# Patient Record
Sex: Female | Born: 1990 | Race: Black or African American | Hispanic: No | Marital: Single | Smoking: Former smoker
Health system: Southern US, Community
[De-identification: ages and names within clinical notes are randomized; demographics above are authoritative.]

## PROBLEM LIST (undated history)

## (undated) DIAGNOSIS — B9689 Other specified bacterial agents as the cause of diseases classified elsewhere: Secondary | ICD-10-CM

## (undated) DIAGNOSIS — N760 Acute vaginitis: Secondary | ICD-10-CM

## (undated) DIAGNOSIS — E785 Hyperlipidemia, unspecified: Secondary | ICD-10-CM

---
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
## 2017-02-12 ENCOUNTER — Ambulatory Visit
Admission: EM | Admit: 2017-02-12 | Discharge: 2017-02-12 | Disposition: A | Discharge status: Home / Self Care | Payer: BC Managed Care – PPO | Attending: Family Medicine | Admitting: Family Medicine

## 2017-02-12 DIAGNOSIS — N760 Acute vaginitis: Secondary | ICD-10-CM

## 2017-02-12 DIAGNOSIS — B9689 Other specified bacterial agents as the cause of diseases classified elsewhere: Secondary | ICD-10-CM

## 2017-02-12 DIAGNOSIS — R05 Cough: Secondary | ICD-10-CM | POA: Diagnosis not present

## 2017-02-12 DIAGNOSIS — R062 Wheezing: Secondary | ICD-10-CM | POA: Diagnosis not present

## 2017-02-12 DIAGNOSIS — B373 Candidiasis of vulva and vagina: Secondary | ICD-10-CM | POA: Diagnosis not present

## 2017-02-12 DIAGNOSIS — B3731 Acute candidiasis of vulva and vagina: Secondary | ICD-10-CM

## 2017-02-12 HISTORY — DX: Other specified bacterial agents as the cause of diseases classified elsewhere: B96.89

## 2017-02-12 HISTORY — DX: Other specified bacterial agents as the cause of diseases classified elsewhere: N76.0

## 2017-02-12 LAB — WET PREP, GENITAL
Sperm: NONE SEEN
TRICH WET PREP: NONE SEEN

## 2017-02-12 LAB — CHLAMYDIA/NGC RT PCR (ARMC ONLY)
Chlamydia Tr: NOT DETECTED
N gonorrhoeae: NOT DETECTED

## 2017-02-12 MED ORDER — METRONIDAZOLE 500 MG PO TABS: 500.0000 mg | ORAL_TABLET | Freq: Two times a day (BID) | ORAL | 0 refills | Status: DC

## 2017-02-12 MED ORDER — FLUCONAZOLE 150 MG PO TABS: 150.0000 mg | ORAL_TABLET | Freq: Every day | ORAL | 0 refills | Status: DC

## 2017-02-12 NOTE — ED Triage Notes (Signed)
26 year old [place] woman is here today with complaints of productive cough; nasal drainage; wheezing for the last two weeks. She states she has only used her Albuterol inhaler for the wheezing but has not taken anything OTC to help with the cough and drainage.  She also states she has been having a vaginal discharge for the last week. She states she does have an odor and white discharge. She states she does have a history of bacterial vaginitis and has not used anything OTC for relief.

## 2017-02-12 NOTE — ED Provider Notes (Signed)
[facility]    CSN: [id number] Arrival date & time: 02/12/17  1909     History   Chief Complaint Chief Complaint  Patient presents with  . Cough  . Wheezing  . Vaginal Discharge    HPI Christine Vazquez is a 26 y.o. female.   The history is provided by the patient.  Cough  Associated symptoms: rhinorrhea and wheezing   Associated symptoms: no fever   Wheezing  Associated symptoms: cough and rhinorrhea   Associated symptoms: no fever   Vaginal Discharge  Quality:  White and malodorous Severity:  Moderate Onset quality:  Sudden Duration:  1 week Timing:  Constant Progression:  Worsening Chronicity:  New Context: spontaneously   Context: not recent antibiotic use   Relieved by:  None tried Associated symptoms: vaginal itching   Associated symptoms: no dysuria, no fever, no genital lesions, no rash and no urinary frequency   Risk factors: no new sexual partner  STD exposure: unknown.   URI  Presenting symptoms: congestion, cough and rhinorrhea   Presenting symptoms: no fever   Severity:  Mild Onset quality:  Sudden Duration:  2 weeks Timing:  Constant Progression:  Unchanged Relieved by:  Prescription medications (albuterol inhaler) Associated symptoms: wheezing   Associated symptoms: no sinus pain   Risk factors: chronic respiratory disease (asthma)     Past Medical History:  Diagnosis Date  . Bacterial vaginitis     There are no active problems to display for this patient.   History reviewed. No pertinent surgical history.  OB History    No data available       Home Medications    Prior to Admission medications   Medication Sig Start Date End Date Taking? Authorizing Provider  fluconazole (DIFLUCAN) 150 MG tablet Take 1 tablet (150 mg total) by mouth daily. 02/12/17   Payton Mccallum, MD  metroNIDAZOLE (FLAGYL) 500 MG tablet Take 1 tablet (500 mg total) by mouth 2 (two) times daily. 02/12/17   Payton Mccallum, MD    Family  History Family History  Problem Relation Age of Onset  . Breast cancer Mother   . Hypertension Father   . Hyperlipidemia Father   . Thrombosis Brother     Social History Social History  Substance Use Topics  . Smoking status: Former Smoker    Types: Cigars  . Smokeless tobacco: Former Neurosurgeon  . Alcohol use Yes     Comment: socially     Allergies   Patient has no known allergies.   Review of Systems Review of Systems  Constitutional: Negative for fever.  HENT: Positive for congestion and rhinorrhea. Negative for sinus pain.   Respiratory: Positive for cough and wheezing.   Genitourinary: Positive for vaginal discharge. Negative for dysuria.     Physical Exam Triage Vital Signs ED Triage Vitals  Enc Vitals Group     BP 02/12/17 1937 127/80     Pulse Rate 02/12/17 1937 91     Resp 02/12/17 1937 18     Temp 02/12/17 1937 99 F (37.2 C)     Temp Source 02/12/17 1937 Oral     SpO2 02/12/17 1937 100 %     Weight 02/12/17 1938 220 lb (99.8 kg)     Height 02/12/17 1938 5\' 4"  (1.626 m)     Head Circumference --      Peak Flow --      Pain Score --      Pain Loc --  Pain Edu? --      Excl. in GC? --    No data found.   Updated Vital Signs BP 127/80 (BP Location: Left Arm)   Pulse 91   Temp 99 F (37.2 C) (Oral)   Resp 18   Ht 5\' 4"  (1.626 m)   Wt 220 lb (99.8 kg)   LMP 02/06/2017 (Exact Date)   SpO2 100%   BMI 37.76 kg/m   Visual Acuity Right Eye Distance:   Left Eye Distance:   Bilateral Distance:    Right Eye Near:   Left Eye Near:    Bilateral Near:     Physical Exam  Constitutional: She appears well-developed and well-nourished. No distress.  HENT:  Head: Normocephalic and atraumatic.  Right Ear: Tympanic membrane, external ear and ear canal normal.  Left Ear: Tympanic membrane, external ear and ear canal normal.  Nose: Rhinorrhea present. No mucosal edema, nose lacerations, sinus tenderness, nasal deformity, septal deviation or nasal  septal hematoma. No epistaxis.  No foreign bodies. Right sinus exhibits no maxillary sinus tenderness and no frontal sinus tenderness. Left sinus exhibits no maxillary sinus tenderness and no frontal sinus tenderness.  Mouth/Throat: Uvula is midline, oropharynx is clear and moist and mucous membranes are normal. No oropharyngeal exudate.  Eyes: Pupils are equal, round, and reactive to light. Conjunctivae and EOM are normal. Right eye exhibits no discharge. Left eye exhibits no discharge. No scleral icterus.  Neck: Normal range of motion. Neck supple. No thyromegaly present.  Cardiovascular: Normal rate, regular rhythm and normal heart sounds.   Pulmonary/Chest: Effort normal and breath sounds normal. No respiratory distress. She has no wheezes. She has no rales.  Genitourinary: Pelvic exam was performed with patient supine. There is no lesion on the right labia. There is no lesion on the left labia. Cervix exhibits no discharge and no friability. No bleeding in the vagina. No foreign body in the vagina. No signs of injury around the vagina. Vaginal discharge found.  Lymphadenopathy:    She has no cervical adenopathy.  Skin: She is not diaphoretic.  Nursing note and vitals reviewed.    UC Treatments / Results  Labs (all labs ordered are listed, but only abnormal results are displayed) Labs Reviewed  WET PREP, GENITAL - Abnormal; Notable for the following:       Result Value   Yeast Wet Prep HPF POC PRESENT (*)    Clue Cells Wet Prep HPF POC PRESENT (*)    WBC, Wet Prep HPF POC MODERATE (*)    All other components within normal limits  CHLAMYDIA/NGC RT PCR [facility] ONLY)    EKG  EKG Interpretation None       Radiology No results found.  Procedures Procedures (including critical care time)  Medications Ordered in UC Medications - No data to display   Initial Impression / Assessment and Plan / UC Course  I have reviewed the triage vital signs and the nursing  notes.  Pertinent labs & imaging results that were available during my care of the patient were reviewed by me and considered in my medical decision making (see chart for details).       Final Clinical Impressions(s) / UC Diagnoses   Final diagnoses:  Wheezing  Bacterial vaginosis  Yeast vaginitis    New Prescriptions New Prescriptions   FLUCONAZOLE (DIFLUCAN) 150 MG TABLET    Take 1 tablet (150 mg total) by mouth daily.   METRONIDAZOLE (FLAGYL) 500 MG TABLET    Take 1 tablet (  500 mg total) by mouth 2 (two) times daily.   1. Lab results and diagnosis reviewed with patient 2. rx as per orders above; reviewed possible side effects, interactions, risks and benefits  3. Recommend continue albuterol inhaler prn 4. Follow-up prn if symptoms worsen or don't improve   Payton Mccallum, MD 02/12/17 2029

## 2017-07-29 ENCOUNTER — Ambulatory Visit
Admission: EM | Admit: 2017-07-29 | Discharge: 2017-07-29 | Disposition: A | Discharge status: Home / Self Care | Payer: BC Managed Care – PPO | Attending: Family Medicine | Admitting: Family Medicine

## 2017-07-29 ENCOUNTER — Encounter: Payer: Self-pay | Admitting: Gynecology

## 2017-07-29 ENCOUNTER — Other Ambulatory Visit: Payer: Self-pay

## 2017-07-29 DIAGNOSIS — M26623 Arthralgia of bilateral temporomandibular joint: Secondary | ICD-10-CM | POA: Diagnosis not present

## 2017-07-29 DIAGNOSIS — G4763 Sleep related bruxism: Secondary | ICD-10-CM

## 2017-07-29 MED ORDER — NAPROXEN 500 MG PO TABS: 500.0000 mg | ORAL_TABLET | Freq: Two times a day (BID) | ORAL | 0 refills | Status: AC

## 2017-07-29 NOTE — ED Provider Notes (Signed)
[facility]    CSN: [id number] Arrival date & time: 07/29/17  0910     History   Chief Complaint Chief Complaint  Patient presents with  . Otalgia    HPI Christine Vazquez is a 27 y.o. female.   HPI  27 year old female presents with bilateral ear pain that she has had for 1 week.  She indicates the TMJ area with radiation along her lower jaw and to the neck.  She had more pain last night while sleeping.  She does not have a history of bruxism.  Denies any tinnitus or ear drainage pain in the ear itself.  She has had no fever or chills.  She has not been chewing any foods such as taffy or meat.  She says sometimes it feels actually better to keep moving her jaw.       Past Medical History:  Diagnosis Date  . Bacterial vaginitis     There are no active problems to display for this patient.   History reviewed. No pertinent surgical history.  OB History    No data available       Home Medications    Prior to Admission medications   Medication Sig Start Date End Date Taking? Authorizing Provider  naproxen (NAPROSYN) 500 MG tablet Take 1 tablet (500 mg total) by mouth 2 (two) times daily with a meal. 07/29/17   Christine Vazquez, Brinly Maietta P, PA-C    Family History Family History  Problem Relation Age of Onset  . Breast cancer Mother   . Hypertension Father   . Hyperlipidemia Father   . Thrombosis Brother     Social History Social History   Tobacco Use  . Smoking status: Former Smoker    Types: Cigars  . Smokeless tobacco: Former Engineer, waterUser  Substance Use Topics  . Alcohol use: Yes    Comment: socially  . Drug use: No     Allergies   Patient has no known allergies.   Review of Systems Review of Systems  Constitutional: Positive for activity change. Negative for appetite change, chills, fatigue and fever.  HENT: Positive for ear pain.   All other systems reviewed and are negative.    Physical Exam Triage Vital Signs ED Triage Vitals  Enc Vitals  Group     BP 07/29/17 1049 93/76     Pulse Rate 07/29/17 1049 67     Resp 07/29/17 1049 16     Temp 07/29/17 1049 98.4 F (36.9 C)     Temp Source 07/29/17 1049 Oral     SpO2 07/29/17 1049 100 %     Weight 07/29/17 1050 226 lb (102.5 kg)     Height 07/29/17 1050 5\' 4"  (1.626 m)     Head Circumference --      Peak Flow --      Pain Score 07/29/17 1050 4     Pain Loc --      Pain Edu? --      Excl. in GC? --    No data found.  Updated Vital Signs BP 93/76 (BP Location: Left Arm)   Pulse 67   Temp 98.4 F (36.9 C) (Oral)   Resp 16   Ht 5\' 4"  (1.626 m)   Wt 226 lb (102.5 kg)   LMP 07/15/2017   SpO2 100%   BMI 38.79 kg/m   Visual Acuity Right Eye Distance:   Left Eye Distance:   Bilateral Distance:    Right Eye Near:   Left Eye  Near:    Bilateral Near:     Physical Exam  Constitutional: She is oriented to person, place, and time. She appears well-developed and well-nourished. No distress.  HENT:  Head: Normocephalic.  Right Ear: External ear normal.  Left Ear: External ear normal.  Nose: Nose normal.  Mouth/Throat: Oropharynx is clear and moist. No oropharyngeal exudate.  Examination shows redness over the TMJs bilaterally and which is exacerbated with jaw motion.  She has no pain with movement of the tragus or auricle.  Mild tenderness along the inferior jaw line into the neck.  She has full cervical range of motion.  Eyes: Pupils are equal, round, and reactive to light. Right eye exhibits no discharge. Left eye exhibits no discharge.  Neck: Normal range of motion. Neck supple.  Musculoskeletal: Normal range of motion.  Lymphadenopathy:    She has no cervical adenopathy.  Neurological: She is alert and oriented to person, place, and time.  Skin: Skin is warm and dry. She is not diaphoretic.  Psychiatric: She has a normal mood and affect. Her behavior is normal. Judgment and thought content normal.  Nursing note and vitals reviewed.    UC Treatments / Results    Labs (all labs ordered are listed, but only abnormal results are displayed) Labs Reviewed - No data to display  EKG  EKG Interpretation None       Radiology No results found.  Procedures Procedures (including critical care time)  Medications Ordered in UC Medications - No data to display   Initial Impression / Assessment and Plan / UC Course  I have reviewed the triage vital signs and the nursing notes.  Pertinent labs & imaging results that were available during my care of the patient were reviewed by me and considered in my medical decision making (see chart for details).     Plan: 1. Test/x-ray results and diagnosis reviewed with patient 2. rx as per orders; risks, benefits, potential side effects reviewed with patient 3. Recommend supportive treatment with anti-inflammatories.  Soft food so as not to exacerbate jaw pain.  Recommend following up with a dentist familiar treating TMJ  For evaluation of bruxism. 4. F/u prn if symptoms worsen or don't improve   Final Clinical Impressions(s) / UC Diagnoses   Final diagnoses:  TMJ tenderness, bilateral  Bruxism, sleep-related    ED Discharge Orders        Ordered    naproxen (NAPROSYN) 500 MG tablet  2 times daily with meals     07/29/17 1140       Controlled Substance Prescriptions [place] Controlled Substance Registry consulted? Not Applicable   Christine Feil, PA-C 07/29/17 1513

## 2017-07-29 NOTE — ED Triage Notes (Signed)
Per patient bilateral ear pain x 1 week.

## 2018-01-22 ENCOUNTER — Ambulatory Visit
Admission: EM | Admit: 2018-01-22 | Discharge: 2018-01-22 | Disposition: A | Discharge status: Home / Self Care | Payer: BC Managed Care – PPO | Attending: Family Medicine | Admitting: Family Medicine

## 2018-01-22 ENCOUNTER — Other Ambulatory Visit: Payer: Self-pay

## 2018-01-22 ENCOUNTER — Encounter: Payer: Self-pay | Admitting: Gynecology

## 2018-01-22 DIAGNOSIS — R21 Rash and other nonspecific skin eruption: Secondary | ICD-10-CM | POA: Diagnosis not present

## 2018-01-22 MED ORDER — PREDNISONE 10 MG (21) PO TBPK: ORAL_TABLET | ORAL | 0 refills | Status: AC

## 2018-01-22 NOTE — ED Triage Notes (Signed)
Patient c/o rash on  Arms / legs and back over 1 week.

## 2018-01-22 NOTE — Discharge Instructions (Signed)
Benadryl as needed.  Prednisone as prescribed.  If persists, see Dermatology.  Take care  Dr. Adriana Simasook

## 2018-01-22 NOTE — ED Provider Notes (Signed)
[facility]    CSN: [id number] Arrival date & time: 01/22/18  0910  History   Chief Complaint Chief Complaint  Patient presents with  . Rash   HPI  27 year old female presents with rash.  1 week history of rash.  Raised, red, itchy.  Located on the arms, legs, back.  Improve and then recur.  Improves with Benadryl.  No new exposures that she is aware of.  No known exacerbating factors.  No other associated symptoms.  No other complaints.  Social History Social History   Tobacco Use  . Smoking status: Former Smoker    Types: Cigars  . Smokeless tobacco: Former Engineer, water Use Topics  . Alcohol use: Yes    Comment: socially  . Drug use: No     Allergies   Patient has no known allergies.   Review of Systems Review of Systems  Constitutional: Negative.   Skin: Positive for rash.   Physical Exam Triage Vital Signs ED Triage Vitals  Enc Vitals Group     BP 01/22/18 0922 131/74     Pulse Rate 01/22/18 0922 77     Resp 01/22/18 0922 18     Temp 01/22/18 0922 98.6 F (37 C)     Temp Source 01/22/18 0922 Oral     SpO2 01/22/18 0922 99 %     Weight 01/22/18 0923 225 lb (102.1 kg)     Height 01/22/18 0923 5\' 4"  (1.626 m)     Head Circumference --      Peak Flow --      Pain Score 01/22/18 0923 0     Pain Loc --      Pain Edu? --      Excl. in GC? --    Updated Vital Signs BP 131/74 (BP Location: Left Arm)   Pulse 77   Temp 98.6 F (37 C) (Oral)   Resp 18   Ht 5\' 4"  (1.626 m)   Wt 225 lb (102.1 kg)   LMP 01/15/2018   SpO2 99%   BMI 38.62 kg/m   Physical Exam  Constitutional: She is oriented to person, place, and time. She appears well-developed. No distress.  HENT:  Head: Normocephalic and atraumatic.  Pulmonary/Chest: Effort normal. No respiratory distress.  Neurological: She is alert and oriented to person, place, and time.  Skin:  Upper extremities with a raised erythematous patches.  Psychiatric: She has a normal mood and  affect. Her behavior is normal.  Nursing note and vitals reviewed.  UC Treatments / Results  Labs (all labs ordered are listed, but only abnormal results are displayed) Labs Reviewed - No data to display  EKG None  Radiology No results found.  Procedures Procedures (including critical care time)  Medications Ordered in UC Medications - No data to display  Initial Impression / Assessment and Plan / UC Course  I have reviewed the triage vital signs and the nursing notes.  Pertinent labs & imaging results that were available during my care of the patient were reviewed by me and considered in my medical decision making (see chart for details).    27 year old female presents with rash.  Appears to be contact or allergic in nature.  Continue Benadryl.  Prednisone as prescribed.  Final Clinical Impressions(s) / UC Diagnoses   Final diagnoses:  Rash     Discharge Instructions     Benadryl as needed.  Prednisone as prescribed.  If persists, see Dermatology.  Take care  Dr. Adriana Simas  ED Prescriptions    Medication Sig Dispense Auth. Provider   predniSONE (STERAPRED UNI-PAK 21 TAB) 10 MG (21) TBPK tablet 6 tablets on day 1; then decrease by 1 tablet daily until gone. 21 tablet Tommie Samsook, Sharla Tankard G, DO     Controlled Substance Prescriptions [place] Controlled Substance Registry consulted? Not Applicable   Tommie SamsCook, Tima Curet G, [place] 01/22/18 [id number]

## 2018-07-26 ENCOUNTER — Other Ambulatory Visit: Payer: Self-pay

## 2018-07-26 ENCOUNTER — Emergency Department
Admission: EM | Admit: 2018-07-26 | Discharge: 2018-07-26 | Disposition: A | Discharge status: Home / Self Care | Payer: BC Managed Care – PPO | Attending: Emergency Medicine | Admitting: Emergency Medicine

## 2018-07-26 ENCOUNTER — Emergency Department: Payer: BC Managed Care – PPO

## 2018-07-26 DIAGNOSIS — M940 Chondrocostal junction syndrome [Tietze]: Secondary | ICD-10-CM | POA: Insufficient documentation

## 2018-07-26 DIAGNOSIS — R079 Chest pain, unspecified: Secondary | ICD-10-CM | POA: Diagnosis present

## 2018-07-26 DIAGNOSIS — Z87891 Personal history of nicotine dependence: Secondary | ICD-10-CM | POA: Diagnosis not present

## 2018-07-26 DIAGNOSIS — Z79899 Other long term (current) drug therapy: Secondary | ICD-10-CM | POA: Diagnosis not present

## 2018-07-26 HISTORY — DX: Hyperlipidemia, unspecified: E78.5

## 2018-07-26 LAB — CBC
HCT: 38.6 % (ref 36.0–46.0)
HEMOGLOBIN: 12.3 g/dL (ref 12.0–15.0)
MCH: 29.6 pg (ref 26.0–34.0)
MCHC: 31.9 g/dL (ref 30.0–36.0)
MCV: 92.8 fL (ref 80.0–100.0)
Platelets: 436 10*3/uL — ABNORMAL HIGH (ref 150–400)
RBC: 4.16 MIL/uL (ref 3.87–5.11)
RDW: 12.5 % (ref 11.5–15.5)
WBC: 8.1 10*3/uL (ref 4.0–10.5)
nRBC: 0 % (ref 0.0–0.2)

## 2018-07-26 LAB — BASIC METABOLIC PANEL
Anion gap: 5 (ref 5–15)
BUN: 14 mg/dL (ref 6–20)
CO2: 27 mmol/L (ref 22–32)
Calcium: 9.1 mg/dL (ref 8.9–10.3)
Chloride: 107 mmol/L (ref 98–111)
Creatinine, Ser: 0.64 mg/dL (ref 0.44–1.00)
GFR calc Af Amer: >60 mL/min (ref >60)
Glucose, Bld: 93 mg/dL (ref 70–99)
Potassium: 4 mmol/L (ref 3.5–5.1)
Sodium: 139 mmol/L (ref 135–145)

## 2018-07-26 LAB — POCT PREGNANCY, URINE: Preg Test, Ur: NEGATIVE

## 2018-07-26 LAB — TROPONIN I: Troponin I: <0.03 ng/mL (ref <0.03)

## 2018-07-26 NOTE — ED Provider Notes (Signed)
[facility] Emergency Department Provider Note    First MD Initiated Contact with Patient 07/26/18 2004     (approximate)  I have reviewed the triage vital signs and the nursing notes.   HISTORY  Chief Complaint Chest Pain   HPI Bronson IngFlorence O Dubey is a 27 y.o. female the emergency department with intermittent central nonradiating chest pain which patient states is been happening for many months".  Patient denies any shortness of breath no lower extremity pain or swelling.  No personal history of DVT PE or CAD.  Patient denies any abdominal pain no nausea or vomiting.     Past Medical History:  Diagnosis Date  . Bacterial vaginitis   . Hyperlipidemia     There are no active problems to display for this patient.   History reviewed. No pertinent surgical history.  Prior to Admission medications   Medication Sig Start Date End Date Taking? Authorizing Provider  naproxen (NAPROSYN) 500 MG tablet Take 1 tablet (500 mg total) by mouth 2 (two) times daily with a meal. 07/29/17   Lutricia Feiloemer, William P, PA-C  predniSONE (STERAPRED UNI-PAK 21 TAB) 10 MG (21) TBPK tablet 6 tablets on day 1; then decrease by 1 tablet daily until gone. 01/22/18   Tommie Samsook, Jayce G, DO    Allergies No known drug allergies  Family History  Problem Relation Age of Onset  . Breast cancer Mother   . Hypertension Father   . Hyperlipidemia Father   . Thrombosis Brother     Social History Social History   Tobacco Use  . Smoking status: Former Smoker    Types: Cigars  . Smokeless tobacco: Former Engineer, waterUser  Substance Use Topics  . Alcohol use: Yes    Comment: socially  . Drug use: No    Review of Systems Constitutional: No fever/chills Eyes: No visual changes. ENT: No sore throat. Cardiovascular: Positive for chest pain. Respiratory: Denies shortness of breath. Gastrointestinal: No abdominal pain.  No nausea, no vomiting.  No diarrhea.  No constipation. Genitourinary: Negative for  dysuria. Musculoskeletal: Negative for neck pain.  Negative for back pain. Integumentary: Negative for rash. Neurological: Negative for headaches, focal weakness or numbness.  ____________________________________________   PHYSICAL EXAM:  VITAL SIGNS: ED Triage Vitals  Enc Vitals Group     BP 07/26/18 1556 132/71     Pulse Rate 07/26/18 1556 82     Resp 07/26/18 1556 17     Temp 07/26/18 1556 98.3 F (36.8 C)     Temp Source 07/26/18 1556 Oral     SpO2 07/26/18 1556 99 %     Weight 07/26/18 1557 100.7 kg (222 lb)     Height 07/26/18 1557 1.626 m (5\' 4" )     Head Circumference --      Peak Flow --      Pain Score 07/26/18 1557 0     Pain Loc --      Pain Edu? --      Excl. in GC? --     Constitutional: Alert and oriented. Well appearing and in no acute distress. Eyes: Conjunctivae are normal. Mouth/Throat: Mucous membranes are moist.  Oropharynx non-erythematous. Neck: No stridor.   Cardiovascular: Normal rate, regular rhythm. Good peripheral circulation. Grossly normal heart sounds. Chest: Pain to palpation lower costosternal margins bilaterally Respiratory: Normal respiratory effort.  No retractions. Lungs CTAB. Gastrointestinal: Soft and nontender. No distention.  Musculoskeletal: No lower extremity tenderness nor edema. No gross deformities of extremities. Neurologic:  Normal speech  and language. No gross focal neurologic deficits are appreciated.  Skin:  Skin is warm, dry and intact. No rash noted. Psychiatric: Mood and affect are normal. Speech and behavior are normal.  ____________________________________________   LABS (all labs ordered are listed, but only abnormal results are displayed)  Labs Reviewed  CBC - Abnormal; Notable for the following components:      Result Value   Platelets 436 (*)    All other components within normal limits  BASIC METABOLIC PANEL  TROPONIN I  POC URINE PREG, ED  POCT PREGNANCY, URINE    ____________________________________________  EKG  ED ECG REPORT I, [place] Joe Gee, the attending physician, personally viewed and interpreted this ECG.   Date: 07/26/2018  EKG Time: 3:51 PM  Rate: 86  Rhythm: Normal sinus rhythm  Axis: Normal  Intervals: Normal  ST&T Change: None  ____________________________________________  RADIOLOGY I, [place] N Cebastian Neis, personally viewed and evaluated these images (plain radiographs) as part of my medical decision making, as well as reviewing the written report by the radiologist.  ED MD interpretation: No acute cardiopulmonary disease noted on chest x-ray per radiologist.  Official radiology report(s): Dg Chest 2 View  Result Date: 07/26/2018 CLINICAL DATA:  Substernal chest pain for 3 months. EXAM: CHEST - 2 VIEW COMPARISON:  Patient's prior chest x-ray is not available on PACs for comparison. FINDINGS: The heart size and mediastinal contours are within normal limits. Both lungs are clear. The visualized skeletal structures are unremarkable. IMPRESSION: No active cardiopulmonary disease. Electronically Signed   By: Sherian ReinWei-Chen  Lin M.D.   On: 07/26/2018 16:13     Procedures   ____________________________________________   INITIAL IMPRESSION / ASSESSMENT AND PLAN / ED COURSE  As part of my medical decision making, I reviewed the following data within the electronic MEDICAL RECORD NUMBER   27 year old female presenting with above-stated history and physical exam secondary to reproducible chest pain on exam.  EKG revealed no evidence of ischemia or infarction with normal intervals.  Opponent also negative.  Patient with no pain at present no shortness of breath no lower extremity pain or swelling and as such while pulmonary emboli was considered this is unlikely given presentation.  Suspect costochondritis is the etiology for the patient's discomfort    FINAL CLINICAL IMPRESSION(S) / ED DIAGNOSES  Final diagnoses:  Costochondritis      MEDICATIONS GIVEN DURING THIS VISIT:  Medications - No data to display   ED Discharge Orders    None       Note:  This document was prepared using Dragon voice recognition software and may include unintentional dictation errors.    Darci CurrentBrown, Paulden N, MD 07/26/18 2035

## 2018-07-26 NOTE — ED Notes (Signed)
Chest pain for a few months. Thinks indigestion but burping does not alleviate. Aunt told her she may have an aneurysm and pt got scared.

## 2018-07-26 NOTE — ED Triage Notes (Signed)
Pt c/o substernal chest pain intermittently for the past 2-3 months states it varies from lasting a few seconds to a few minutes. Denies any pain at present.

## 2020-03-26 IMAGING — CR DG CHEST 2V
1 series · 2 of 2 positions shown · non-contrast
Comparison: Patient's prior chest x-ray is not available on PACs
for comparison.

CLINICAL DATA: Substernal chest pain for 3 months.

EXAM:
CHEST - 2 VIEW

[Series 1: dg chest 2 view · 0.14mm/px · 2 of 2 slices shown]
[im 1/2]
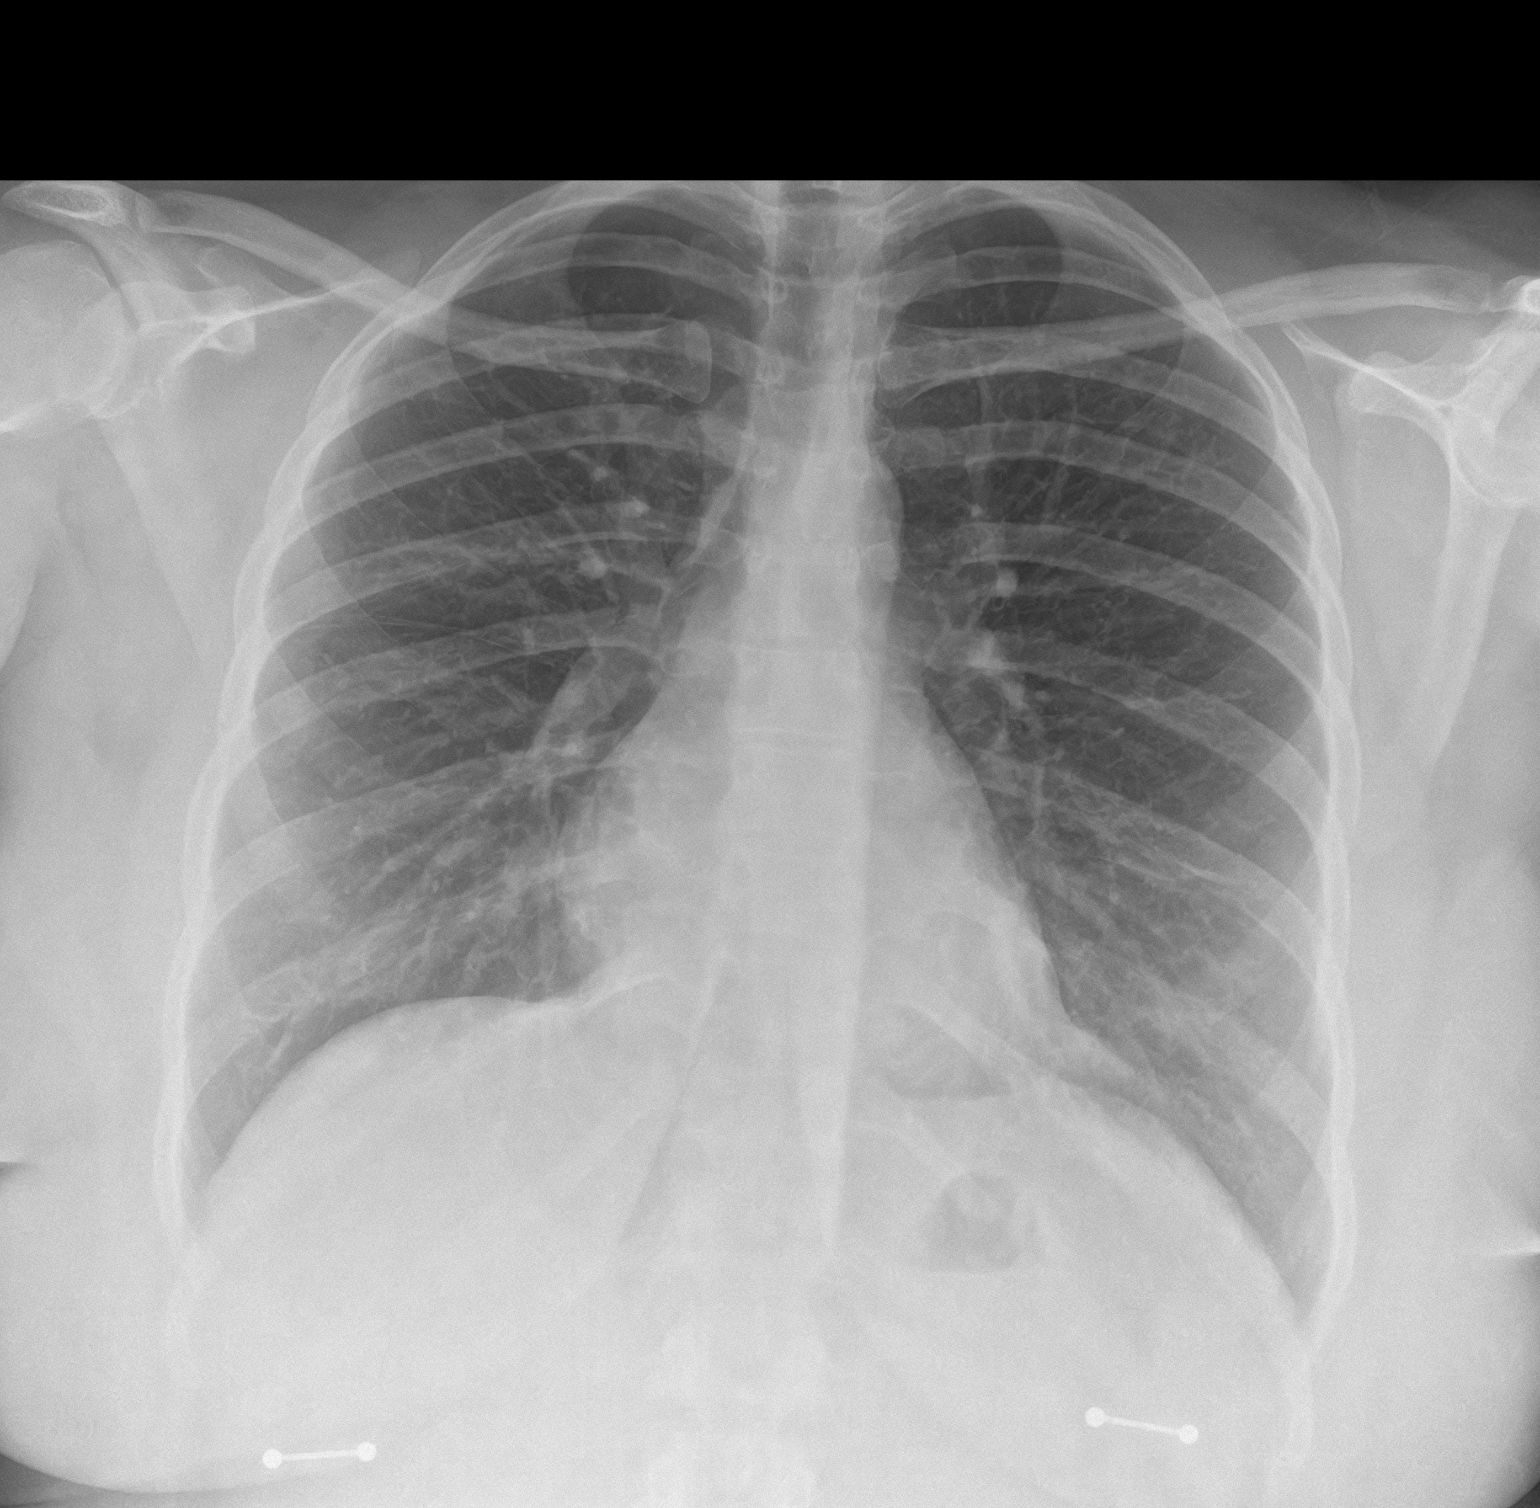
[im 2/2]
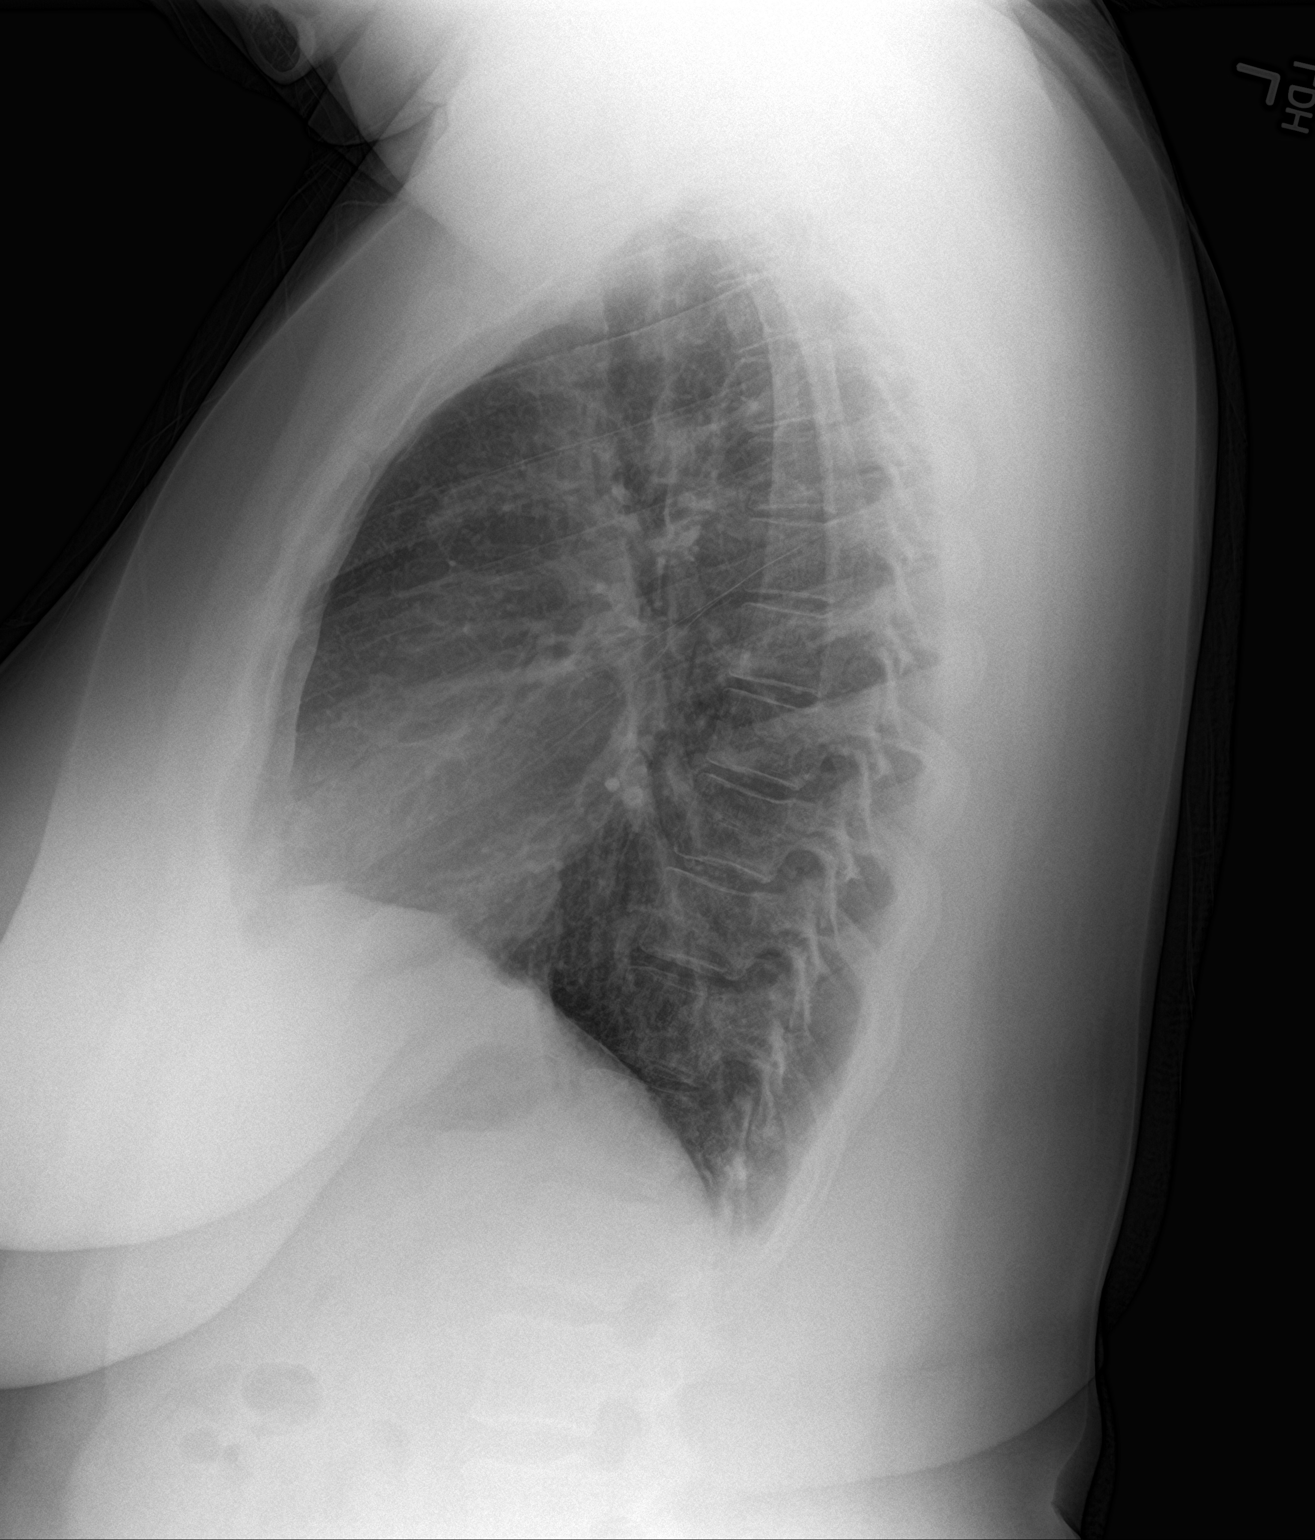

[2 of 2 positions shown; findings below may reference images not displayed]

FINDINGS: The heart size and mediastinal contours are within normal limits.
Both lungs are clear. The visualized skeletal structures are
unremarkable.
IMPRESSION: No active cardiopulmonary disease.

## 2023-10-02 ENCOUNTER — Ambulatory Visit
Admission: EM | Admit: 2023-10-02 | Discharge: 2023-10-02 | Disposition: A | Discharge status: Home / Self Care | Attending: Family Medicine | Admitting: Family Medicine

## 2023-10-02 DIAGNOSIS — J069 Acute upper respiratory infection, unspecified: Secondary | ICD-10-CM | POA: Diagnosis present

## 2023-10-02 LAB — RESP PANEL BY RT-PCR (FLU A&B, COVID) ARPGX2
Influenza A by PCR: NEGATIVE
Influenza B by PCR: NEGATIVE
SARS Coronavirus 2 by RT PCR: NEGATIVE

## 2023-10-02 LAB — GROUP A STREP BY PCR: Group A Strep by PCR: NOT DETECTED

## 2023-10-02 NOTE — ED Triage Notes (Signed)
 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 Patient states that she woke up yesterday with a sore throat. Slight headache and cough. Bilateral ear pain yesterday.

## 2023-10-02 NOTE — Discharge Instructions (Signed)
 Christine Vazquez's strep, influenza and COVID are all negative. You have a viral respiratory infection that will gradually improve over the next 7-10 days. Cough may last up to 3 weeks.    You can take Tylenol and/or Ibuprofen as needed for fever reduction and pain relief.    For cough: honey 1/2 to 1 teaspoon (you can dilute the honey in water or another fluid). You can also use guaifenesin and dextromethorphan for cough. You can use a humidifier for chest congestion and cough.  If you don't have a humidifier, you can sit in the bathroom with the hot shower running.      For sore throat: try warm salt water gargles, Mucinex sore throat cough drops or cepacol lozenges, throat spray, warm tea or water with lemon/honey, popsicles or ice, or OTC cold relief medicine for throat discomfort. You can also purchase chloraseptic spray at the pharmacy or dollar store.   For congestion: take a daily anti-histamine like Zyrtec, Claritin, and a oral decongestant, such as pseudoephedrine.  You can also use Flonase 1-2 sprays in each nostril daily. Afrin is also a good option, if you do not have high blood pressure.    It is important to stay hydrated: drink plenty of fluids (water, gatorade/powerade/pedialyte, juices, or teas) to keep your throat moisturized and help further relieve irritation/discomfort.    Return or go to the Emergency Department if symptoms worsen or do not improve in the next few days

## 2023-10-02 NOTE — ED Provider Notes (Signed)
 [facility]    CSN: [id number] Arrival date & time: 10/02/23  1141      History   Chief Complaint Chief Complaint  Patient presents with   Sore Throat   Cough   Headache   Otalgia    HPI [name] is a 33 y.o. female.   HPI  History obtained from the patient. Christine Vazquez presents for sore throat, ear pain, coughing, headache that started yesterday. No fever, vomiting, diarrhea, chills or body aches. Took some Elderberry gummies.  Someone came into her office with the flu on Wednesday.         Past Medical History:  Diagnosis Date   Bacterial vaginitis    Hyperlipidemia     There are no active problems to display for this patient.   History reviewed. No pertinent surgical history.  OB History   No obstetric history on file.      Home Medications    Prior to Admission medications   Medication Sig Start Date End Date Taking? Authorizing Provider  naproxen (NAPROSYN) 500 MG tablet Take 1 tablet (500 mg total) by mouth 2 (two) times daily with a meal. 07/29/17   Lutricia Feil, PA-C  predniSONE (STERAPRED UNI-PAK 21 TAB) 10 MG (21) TBPK tablet 6 tablets on day 1; then decrease by 1 tablet daily until gone. 01/22/18   Tommie Sams, DO    Family History Family History  Problem Relation Age of Onset   Breast cancer Mother    Hypertension Father    Hyperlipidemia Father    Thrombosis Brother     Social History Social History   Tobacco Use   Smoking status: Former    Types: Cigars   Smokeless tobacco: Former  Substance Use Topics   Alcohol use: Yes    Comment: socially   Drug use: No     Allergies   Patient has no known allergies.   Review of Systems Review of Systems: negative unless otherwise stated in HPI.      Physical Exam Triage Vital Signs ED Triage Vitals [10/02/23 1209]  Encounter Vitals Group     BP (!) 122/92     Systolic BP Percentile      Diastolic BP Percentile      Pulse Rate 72     Resp 16      Temp 98.5 F (36.9 C)     Temp Source Oral     SpO2 100 %     Weight      Height      Head Circumference      Peak Flow      Pain Score 5     Pain Loc      Pain Education      Exclude from Growth Chart    No data found.  Updated Vital Signs BP (!) 122/92 (BP Location: Left Arm)   Pulse 72   Temp 98.5 F (36.9 C) (Oral)   Resp 16   LMP 09/20/2023 (Approximate)   SpO2 100%   Visual Acuity Right Eye Distance:   Left Eye Distance:   Bilateral Distance:    Right Eye Near:   Left Eye Near:    Bilateral Near:     Physical Exam GEN:     alert, non-toxic appearing female in no distress ***   HENT:  mucus membranes moist, oropharyngeal ***without lesions or ***erythema, no*** tonsillar hypertrophy or exudates, *** moderate erythematous edematous turbinates, ***clear nasal discharge, ***bilateral TM normal EYES:  pupils equal and reactive, ***no scleral injection or discharge NECK:  normal ROM, no ***lymphadenopathy, ***no meningismus   RESP:  no increased work of breathing, ***clear to auscultation bilaterally CVS:   regular rate ***and rhythm Skin:   warm and dry, no rash on visible skin***    UC Treatments / Results  Labs (all labs ordered are listed, but only abnormal results are displayed) Labs Reviewed  GROUP A STREP BY PCR    EKG   Radiology No results found.  Procedures Procedures (including critical care time)  Medications Ordered in UC Medications - No data to display  Initial Impression / Assessment and Plan / UC Course  I have reviewed the triage vital signs and the nursing notes.  Pertinent labs & imaging results that were available during my care of the patient were reviewed by me and considered in my medical decision making (see chart for details).       Pt is a 33 y.o. female who presents for *** days of respiratory symptoms. Christine Vazquez is ***afebrile here without recent antipyretics. Satting well on room air. Overall pt is ***non-toxic  appearing, well hydrated, without respiratory distress. Pulmonary exam ***is unremarkable.  COVID and influenza panel obtained ***and was negative. ***Pt to quarantine until COVID test results or longer if positive.  I will call patient with test results, if positive. History consistent with ***viral respiratory illness. Discussed symptomatic treatment.  Explained lack of efficacy of antibiotics in viral disease.  Typical duration of symptoms discussed.   Return and ED precautions given and voiced understanding. Discussed MDM, treatment plan and plan for follow-up with patient*** who agrees with plan.     Final Clinical Impressions(s) / UC Diagnoses   Final diagnoses:  None   Discharge Instructions   None    ED Prescriptions   None    PDMP not reviewed this encounter.
